# Patient Record
Sex: Male | Born: 1940 | Race: Black or African American | Hispanic: No | Marital: Married | Smoking: Former smoker
Health system: Southern US, Community
[De-identification: ages and names within clinical notes are randomized; demographics above are authoritative.]

## PROBLEM LIST (undated history)

## (undated) DIAGNOSIS — F329 Major depressive disorder, single episode, unspecified: Secondary | ICD-10-CM

## (undated) DIAGNOSIS — F4310 Post-traumatic stress disorder, unspecified: Secondary | ICD-10-CM

## (undated) DIAGNOSIS — E669 Obesity, unspecified: Secondary | ICD-10-CM

## (undated) DIAGNOSIS — F32A Depression, unspecified: Secondary | ICD-10-CM

## (undated) DIAGNOSIS — I480 Paroxysmal atrial fibrillation: Secondary | ICD-10-CM

## (undated) DIAGNOSIS — I872 Venous insufficiency (chronic) (peripheral): Secondary | ICD-10-CM

## (undated) DIAGNOSIS — I10 Essential (primary) hypertension: Secondary | ICD-10-CM

## (undated) DIAGNOSIS — I509 Heart failure, unspecified: Secondary | ICD-10-CM

## (undated) DIAGNOSIS — G4733 Obstructive sleep apnea (adult) (pediatric): Secondary | ICD-10-CM

## (undated) DIAGNOSIS — F1910 Other psychoactive substance abuse, uncomplicated: Secondary | ICD-10-CM

## (undated) DIAGNOSIS — E278 Other specified disorders of adrenal gland: Secondary | ICD-10-CM

## (undated) DIAGNOSIS — F1011 Alcohol abuse, in remission: Secondary | ICD-10-CM

## (undated) DIAGNOSIS — E291 Testicular hypofunction: Secondary | ICD-10-CM

## (undated) DIAGNOSIS — I82409 Acute embolism and thrombosis of unspecified deep veins of unspecified lower extremity: Secondary | ICD-10-CM

## (undated) DIAGNOSIS — E119 Type 2 diabetes mellitus without complications: Secondary | ICD-10-CM

## (undated) DIAGNOSIS — M1990 Unspecified osteoarthritis, unspecified site: Secondary | ICD-10-CM

## (undated) HISTORY — DX: Alcohol abuse, in remission: F10.11

## (undated) HISTORY — DX: Paroxysmal atrial fibrillation: I48.0

## (undated) HISTORY — DX: Venous insufficiency (chronic) (peripheral): I87.2

## (undated) HISTORY — DX: Post-traumatic stress disorder, unspecified: F43.10

## (undated) HISTORY — DX: Testicular hypofunction: E29.1

## (undated) HISTORY — DX: Obstructive sleep apnea (adult) (pediatric): G47.33

## (undated) HISTORY — DX: Other specified disorders of adrenal gland: E27.8

## (undated) HISTORY — DX: Type 2 diabetes mellitus without complications: E11.9

## (undated) HISTORY — DX: Essential (primary) hypertension: I10

## (undated) HISTORY — DX: Unspecified osteoarthritis, unspecified site: M19.90

## (undated) HISTORY — DX: Other psychoactive substance abuse, uncomplicated: F19.10

## (undated) HISTORY — DX: Major depressive disorder, single episode, unspecified: F32.9

## (undated) HISTORY — DX: Heart failure, unspecified: I50.9

## (undated) HISTORY — DX: Obesity, unspecified: E66.9

## (undated) HISTORY — DX: Acute embolism and thrombosis of unspecified deep veins of unspecified lower extremity: I82.409

## (undated) HISTORY — DX: Depression, unspecified: F32.A

---

## 2013-07-15 ENCOUNTER — Other Ambulatory Visit: Payer: Self-pay | Admitting: *Deleted

## 2013-07-15 MED ORDER — OXYCODONE HCL 10 MG PO TABS: 10.0000 mg | ORAL_TABLET | ORAL | Status: DC

## 2013-07-15 NOTE — Telephone Encounter (Signed)
rx filled per protocol  

## 2013-07-18 ENCOUNTER — Non-Acute Institutional Stay (SKILLED_NURSING_FACILITY): Payer: Medicare Other | Admitting: Family

## 2013-07-18 DIAGNOSIS — N189 Chronic kidney disease, unspecified: Secondary | ICD-10-CM

## 2013-07-18 DIAGNOSIS — E1310 Other specified diabetes mellitus with ketoacidosis without coma: Secondary | ICD-10-CM

## 2013-07-18 DIAGNOSIS — E1110 Type 2 diabetes mellitus with ketoacidosis without coma: Secondary | ICD-10-CM

## 2013-07-18 DIAGNOSIS — I10 Essential (primary) hypertension: Secondary | ICD-10-CM

## 2013-07-18 DIAGNOSIS — E270 Other adrenocortical overactivity: Secondary | ICD-10-CM

## 2013-07-18 DIAGNOSIS — E249 Cushing's syndrome, unspecified: Secondary | ICD-10-CM

## 2013-07-18 DIAGNOSIS — I872 Venous insufficiency (chronic) (peripheral): Secondary | ICD-10-CM

## 2013-07-22 ENCOUNTER — Non-Acute Institutional Stay (SKILLED_NURSING_FACILITY): Payer: Medicare Other | Admitting: Internal Medicine

## 2013-07-22 DIAGNOSIS — R404 Transient alteration of awareness: Secondary | ICD-10-CM

## 2013-07-22 DIAGNOSIS — N139 Obstructive and reflux uropathy, unspecified: Secondary | ICD-10-CM

## 2013-07-22 DIAGNOSIS — N179 Acute kidney failure, unspecified: Secondary | ICD-10-CM

## 2013-07-22 DIAGNOSIS — E1129 Type 2 diabetes mellitus with other diabetic kidney complication: Secondary | ICD-10-CM
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
## 2013-07-25 ENCOUNTER — Encounter: Payer: Self-pay | Admitting: Family

## 2013-07-25 ENCOUNTER — Non-Acute Institutional Stay (SKILLED_NURSING_FACILITY): Payer: Medicare Other | Admitting: Family

## 2013-07-25 DIAGNOSIS — I872 Venous insufficiency (chronic) (peripheral): Secondary | ICD-10-CM | POA: Insufficient documentation

## 2013-07-25 DIAGNOSIS — E1110 Type 2 diabetes mellitus with ketoacidosis without coma: Secondary | ICD-10-CM | POA: Insufficient documentation

## 2013-07-25 DIAGNOSIS — N179 Acute kidney failure, unspecified: Secondary | ICD-10-CM

## 2013-07-25 DIAGNOSIS — E270 Other adrenocortical overactivity: Secondary | ICD-10-CM | POA: Insufficient documentation

## 2013-07-25 DIAGNOSIS — I10 Essential (primary) hypertension: Secondary | ICD-10-CM | POA: Insufficient documentation

## 2013-07-25 DIAGNOSIS — N189 Chronic kidney disease, unspecified: Secondary | ICD-10-CM | POA: Insufficient documentation

## 2013-07-25 DIAGNOSIS — E249 Cushing's syndrome, unspecified: Secondary | ICD-10-CM

## 2013-07-25 MED ORDER — SPIRONOLACTONE 100 MG PO TABS: 50.0000 mg | ORAL_TABLET | Freq: Every day | ORAL | Status: DC

## 2013-07-25 NOTE — Progress Notes (Signed)
Patient ID: Sean Davidson, male   DOB: 07/05/1941, 72 y.o.   MRN: [id number]  Date: 07/18/13  Facility: [facility]  Code Status:  Full  Chief Complaint  Patient presents with  . Hospitalization Follow-up    HPI:Pt is being following s/p hospitalization for DVT to left popliteal, + troponin level, afib, and volume overload. The patient presents with no complaints of N/V/D/fever, chills, CP or SOB.  No complaint expressed by pt or health care team.       Allergies  Allergen Reactions  . Indocin [Indomethacin]   . Penicillins   . Sulfa Antibiotics      Medication List       This list is accurate as of: 07/18/13 11:59 PM.  Always use your most recent med list.               acetaminophen 325 MG tablet  Commonly known as:  TYLENOL  Take 650 mg by mouth 3 (three) times daily.     aspirin EC 81 MG tablet  Take 81 mg by mouth daily.     atorvastatin 40 MG tablet  Commonly known as:  LIPITOR  Take 40 mg by mouth daily.     capsicum 0.075 % topical cream  Commonly known as:  ZOSTRIX  Apply 1 application topically as needed.     cyanocobalamin 1000 MCG tablet  Take 1,000 mcg by mouth daily.     diltiazem 180 MG 24 hr capsule  Commonly known as:  DILACOR XR  Take 180 mg by mouth daily.     docusate sodium 100 MG capsule  Commonly known as:  COLACE  Take 100 mg by mouth 2 (two) times daily.     gabapentin 300 MG capsule  Commonly known as:  NEURONTIN  Take 300 mg by mouth 3 (three) times daily.     insulin aspart 100 UNIT/ML injection  Commonly known as:  novoLOG  Inject into the skin 3 (three) times daily with meals.     insulin glargine 100 UNIT/ML injection  Commonly known as:  LANTUS  Inject 80 Units into the skin at bedtime.     ketoconazole 2 % cream  Commonly known as:  NIZORAL  Apply 1 application topically daily.     metaxalone 800 MG tablet  Commonly known as:  SKELAXIN  Take 800 mg by mouth 3 (three) times daily.     methylcellulose 1  % ophthalmic solution  Commonly known as:  ARTIFICIAL TEARS  1 drop 4 (four) times daily.     metoprolol succinate 100 MG 24 hr tablet  Commonly known as:  TOPROL-XL  Take 100 mg by mouth daily. Take with or immediately following a meal.     morphine 15 MG 12 hr tablet  Commonly known as:  MS CONTIN  Take 15 mg by mouth every 12 (twelve) hours.     omeprazole 20 MG capsule  Commonly known as:  PRILOSEC  Take 20 mg by mouth daily.     Oxycodone HCl 10 MG Tabs  Take 1 tablet (10 mg total) by mouth as directed. Take 1 tablet by mouth 4 times a day as needed for pain *NOTE DOSE*     sertraline 100 MG tablet  Commonly known as:  ZOLOFT  Take 100 mg by mouth daily.     spironolactone 100 MG tablet  Commonly known as:  ALDACTONE  Take 100 mg by mouth daily.     terazosin 10 MG capsule  Commonly  known as:  HYTRIN  Take 10 mg by mouth at bedtime.     torsemide 20 MG tablet  Commonly known as:  DEMADEX  Take 20 mg by mouth daily.     warfarin 4 MG tablet  Commonly known as:  COUMADIN  Take 4 mg by mouth daily.         DATA REVIEWED    Laboratory Studies: Reviewed Past Medical History  Diagnosis Date  . CHF (congestive heart failure)   . Hypertension   . Diabetes mellitus without complication   . Adrenal hyperplasia     ACTH-independent macronodular  . Venous insufficiency of leg     BLE-with ulcerations  . PTSD (post-traumatic stress disorder)   . Depression   . Substance abuse   . H/O ETOH abuse   . Obesity   . PAF (paroxysmal atrial fibrillation)   . OSA (obstructive sleep apnea)     CPAP nightly   . DVT, lower extremity     Left Popliteal  . Hypogonadism male   . Arthritis    Review of Systems  Constitutional: Negative.   Eyes: Positive for blurred vision. Negative for pain, discharge and redness.       Corrective lenses  Gastrointestinal: Negative.   Genitourinary: Negative.   Musculoskeletal: Positive for back pain and joint pain.  Skin:  Positive for itching.       Leg Ulcers  Neurological: Negative.   Endo/Heme/Allergies: Negative.   Psychiatric/Behavioral: Positive for depression.       Denies SI/HI     Physical Exam Filed Vitals:   07/25/13 1412  BP: 126/68  Pulse: 86  Temp: 98.6 F (37 C)  Resp: 18   There is no height or weight on file to calculate BMI. Physical Exam  Constitutional: He is oriented to person, place, and time. No distress.  HENT:  Head: Normocephalic.  Mouth/Throat: Oropharynx is clear and moist.  Eyes: Conjunctivae are normal. Pupils are equal, round, and reactive to light.  Neck: Normal range of motion. No thyromegaly present.  Cardiovascular: Normal rate, regular rhythm and normal heart sounds.   3 + Bilateral lower extremity swelling  Pulmonary/Chest: Effort normal and breath sounds normal.  Abdominal: He exhibits distension.  Obese  Genitourinary:  Urine assessed in urinal- clear and amber  Musculoskeletal:       Lumbar back: He exhibits pain.  Granulated midline lumbar scar  Neurological: He is alert and oriented to person, place, and time. GCS eye subscore is 4. GCS verbal subscore is 5. GCS motor subscore is 6.  Psychiatric: He has a normal mood and affect. His speech is normal and behavior is normal. Cognition and memory are normal.    ASSESSMENT/PLAN  CBC w/ diff/CMP Decrease Spirolactone to 50 mg bid  Follow up:prn  Spent 60 minutes

## 2013-07-25 NOTE — Progress Notes (Signed)
Patient ID: Sean Davidson, male   DOB: [birth year], 72 y.o.   MRN: [id number]  Date 07/25/13 Facility: [facility]  Code Status:  Full  Chief Complaint  Patient presents with  . Acute Visit    Abnormal Labs    HPI: Health care team request patient f/u for abnormal labs.  Pt is being followed for the medical management of chronic illnesses. Pt denies N/V/D, fever, chills; however, reports generalized weakness onset  3 days. Pt reports inability to perform ADL's independently,pt reports feeding assistance (which is change from patient's baseline).       Allergies  Allergen Reactions  . Indocin [Indomethacin]   . Penicillins   . Sulfa Antibiotics      Medication List       This list is accurate as of: 07/25/13  3:56 PM.  Always use your most recent med list.               acetaminophen 325 MG tablet  Commonly known as:  TYLENOL  Take 650 mg by mouth 3 (three) times daily.     aspirin EC 81 MG tablet  Take 81 mg by mouth daily.     atorvastatin 40 MG tablet  Commonly known as:  LIPITOR  Take 40 mg by mouth daily.     capsicum 0.075 % topical cream  Commonly known as:  ZOSTRIX  Apply 1 application topically as needed.     cyanocobalamin 1000 MCG tablet  Take 1,000 mcg by mouth daily.     diltiazem 180 MG 24 hr capsule  Commonly known as:  DILACOR XR  Take 180 mg by mouth daily.     docusate sodium 100 MG capsule  Commonly known as:  COLACE  Take 100 mg by mouth 2 (two) times daily.     gabapentin 300 MG capsule  Commonly known as:  NEURONTIN  Take 300 mg by mouth 3 (three) times daily.     insulin aspart 100 UNIT/ML injection  Commonly known as:  novoLOG  Inject into the skin 3 (three) times daily with meals.     insulin glargine 100 UNIT/ML injection  Commonly known as:  LANTUS  Inject 80 Units into the skin at bedtime.     ketoconazole 2 % cream  Commonly known as:  NIZORAL  Apply 1 application topically daily.     metaxalone 800 MG tablet    Commonly known as:  SKELAXIN  Take 800 mg by mouth 3 (three) times daily.     methylcellulose 1 % ophthalmic solution  Commonly known as:  ARTIFICIAL TEARS  1 drop 4 (four) times daily.     metoprolol succinate 100 MG 24 hr tablet  Commonly known as:  TOPROL-XL  Take 100 mg by mouth daily. Take with or immediately following a meal.     morphine 15 MG 12 hr tablet  Commonly known as:  MS CONTIN  Take 15 mg by mouth every 12 (twelve) hours.     omeprazole 20 MG capsule  Commonly known as:  PRILOSEC  Take 20 mg by mouth daily.     Oxycodone HCl 10 MG Tabs  Take 1 tablet (10 mg total) by mouth as directed. Take 1 tablet by mouth 4 times a day as needed for pain *NOTE DOSE*     sertraline 100 MG tablet  Commonly known as:  ZOLOFT  Take 100 mg by mouth daily.     spironolactone 100 MG tablet  Commonly known as:  ALDACTONE  Take 0.5 tablets (50 mg total) by mouth daily.     terazosin 10 MG capsule  Commonly known as:  HYTRIN  Take 10 mg by mouth at bedtime.     torsemide 20 MG tablet  Commonly known as:  DEMADEX  Take 20 mg by mouth daily.     warfarin 4 MG tablet  Commonly known as:  COUMADIN  Take 4 mg by mouth daily.         DATA REVIEWED  Laboratory Studies: 07/25/13-BUN 100, Creatinine 1.42, Sodium 148, Potassium 4.7, Chloride 104, Calcium 9.9 07/22/13-BUN 116, Creatinine4.23, Sodium 135, Potassium 5.7, Chloride 94, Calcium 9.2 Past Medical History  Diagnosis Date  . CHF (congestive heart failure)   . Hypertension   . Diabetes mellitus without complication   . Adrenal hyperplasia     ACTH-independent macronodular  . Venous insufficiency of leg     BLE-with ulcerations  . PTSD (post-traumatic stress disorder)   . Depression   . Substance abuse   . H/O ETOH abuse   . Obesity   . PAF (paroxysmal atrial fibrillation)   . OSA (obstructive sleep apnea)     CPAP nightly   . DVT, lower extremity     Left Popliteal  . Hypogonadism male   . Arthritis      Review of Systems  Constitutional: Negative.   HENT: Negative.   Eyes: Positive for blurred vision.       Corrective lenses  Respiratory: Positive for shortness of breath.        Intermittent  Cardiovascular: Positive for leg swelling.  Gastrointestinal: Negative.   Genitourinary:       Foley Catheter  Musculoskeletal: Positive for back pain.  Skin:       Leg Ulcers  Neurological: Positive for focal weakness.  Endo/Heme/Allergies: Negative.   Psychiatric/Behavioral: Negative.      Physical Exam Filed Vitals:   07/25/13 1550  BP: 140/70  Pulse: 88  Temp: 97.7 F (36.5 C)  Resp: 18   There is no height or weight on file to calculate BMI. Physical Exam  Constitutional: He is oriented to person, place, and time. He appears listless.  HENT:  Head: Normocephalic.  Cardiovascular: Normal rate, regular rhythm and normal heart sounds.   Pulses:      Radial pulses are 3+ on the right side, and 3+ on the left side.       Dorsalis pedis pulses are 1+ on the right side, and 1+ on the left side.  2+ bilat. Pedal edema  Pulmonary/Chest: Effort normal and breath sounds normal.  SOB with moderate exertion noted  Abdominal: Bowel sounds are normal. There is no rebound and no guarding.  Ecchymosis approx. 3 cm to R of umbilicus/ Obese  Genitourinary:  Foley catheter draining yellow urine  Musculoskeletal:       Lumbar back: He exhibits decreased range of motion and pain.  Grimacing noted with position change/palpation  Neurological: He is oriented to person, place, and time. He appears listless.  Generalized Weakness  Skin:  Leg Wraps to BLE intact     ASSESSMENT/PLAN  Acute on Chronic Renal Disease vs Renal Insufficiency -Obtain IV Access -0.45 NS 100 cc/hr x 48 hours -Strict I &O's -Continue Daily Weights -BMP daily for 2 days -Hold Torsemide, Metolazone, Spirolactone -V/s q 4 hours with O2 sats x 48 hours Consultation with Dr. Leanord Hawking for plan  Follow  up:prn  Spent 60 minutes

## 2013-07-26 ENCOUNTER — Non-Acute Institutional Stay (SKILLED_NURSING_FACILITY): Payer: Medicare Other | Admitting: Internal Medicine

## 2013-07-26 DIAGNOSIS — N189 Chronic kidney disease, unspecified: Secondary | ICD-10-CM

## 2013-07-26 DIAGNOSIS — N179 Acute kidney failure, unspecified: Secondary | ICD-10-CM

## 2013-07-26 DIAGNOSIS — R404 Transient alteration of awareness: Secondary | ICD-10-CM

## 2013-07-26 NOTE — Progress Notes (Signed)
Patient ID: Sean Davidson, male   DOB: 1940-09-09, 72 y.o.   MRN: [id number] Facility; [facility] Chief complaint; followup acute on chronic renal failure, delirium History; this is a patient I saw on 11/28 to. He was admitted the facility after being admitted for a chronic period to the [facility]. He has chronic renal insufficiency which was a stage II listed as a baseline creatinine of 1.3 on his discharge summary. He is also listed as having diastolic heart failure, an enlarged prostate, and an ACTH independent macro nodular adrenal hyperplasia on chronic spironolactone. When I saw him on November 28 we have already noted a BUN of 96 a creatinine of 2.3 and a potassium of 5.6. He was on an ACE inhibitor [lisinopril at 5 mg daily] metolazone, Demadex and spironolactone. To complicate matters further it was noted that he was in significant urinary retention and was catheterized for 1200 cc of urine. Subsequent lab work that day revealed a BUN of 116 a creatinine of 4.23 although this was before he is a catheter was placed. Followup lab work from yesterday showed a BUN of 100 a creatinine of 1.4 to potassium of 4.7. His white count is 12 hemoglobin 13 platelets of 191,000 there is a slight left shift with 85% neutrophils. He has since been giving him IV fluids at 100 cc an hour  Further I noted acute delirium when I saw him on 11/28 the. His Zoloft and Neurontin were held. He was placed on empiric doxycycline for concern of a urinary tract infection I don't see the final results of this  Review of systems Respiratory patient is not complaining of cough or shortness of breath Cardiac no chest pain or palpitations GI no abdominal pain GU he has a Foley catheter in place  Physical examination  Gen.; patient looks remarkably better than when I saw him 3 days ago. He is alert and responsive Blood pressure 150/70 temperature 97.7 pulse 90 respirations 20 O2 sat 96%  Respiratoryr shallow but otherwise  clear entry bilaterally Cardiac heart sounds are normal no murmurs no obvious congestive heart failure Abdomen; distended however no tenderness no masses. GU catheter draining clear yellow urine  Impression/plan #1 acute on chronic renal failure; it would appear that we have managed to treat a very serious situation successfully. I suspect most of this was probably due to post renal factors, however prerenal factors as well as ACE inhibitors etc. could be playing a role. I will repeat his basic metabolic panel tomorrow. At some point he will probably need to go back on some of his diuretics however that is not now #2 severe urinary retention secondary to BPH. I will attempt to check his prostate when I'm here later in the week #3 acute delirium secondary to acute renal failure perhaps some degree of medication induced delirium #4 question pyelonephritis he is on doxycycline I am awaiting a culture.

## 2013-07-28 ENCOUNTER — Non-Acute Institutional Stay (SKILLED_NURSING_FACILITY): Payer: Medicare Other | Admitting: Internal Medicine

## 2013-07-28 DIAGNOSIS — I872 Venous insufficiency (chronic) (peripheral): Secondary | ICD-10-CM

## 2013-07-28 DIAGNOSIS — R140 Abdominal distension (gaseous): Secondary | ICD-10-CM

## 2013-07-28 DIAGNOSIS — R141 Gas pain: Secondary | ICD-10-CM

## 2013-07-28 DIAGNOSIS — N189 Chronic kidney disease, unspecified: Secondary | ICD-10-CM

## 2013-08-02 ENCOUNTER — Emergency Department (HOSPITAL_COMMUNITY)
Admission: EM | Admit: 2013-08-02 | Discharge: 2013-08-03 | Disposition: A | Discharge status: Nursing Home (Includes ICF) | Payer: Medicare Other | Attending: Emergency Medicine | Admitting: Emergency Medicine

## 2013-08-02 ENCOUNTER — Emergency Department (HOSPITAL_COMMUNITY): Payer: Medicare Other

## 2013-08-02 ENCOUNTER — Encounter (HOSPITAL_COMMUNITY): Payer: Self-pay | Admitting: Emergency Medicine

## 2013-08-02 DIAGNOSIS — Z86718 Personal history of other venous thrombosis and embolism: Secondary | ICD-10-CM | POA: Insufficient documentation

## 2013-08-02 DIAGNOSIS — K5900 Constipation, unspecified: Secondary | ICD-10-CM | POA: Insufficient documentation

## 2013-08-02 DIAGNOSIS — F3289 Other specified depressive episodes: Secondary | ICD-10-CM | POA: Insufficient documentation

## 2013-08-02 DIAGNOSIS — G4733 Obstructive sleep apnea (adult) (pediatric): Secondary | ICD-10-CM | POA: Insufficient documentation

## 2013-08-02 DIAGNOSIS — F329 Major depressive disorder, single episode, unspecified: Secondary | ICD-10-CM | POA: Insufficient documentation

## 2013-08-02 DIAGNOSIS — R109 Unspecified abdominal pain: Secondary | ICD-10-CM | POA: Insufficient documentation

## 2013-08-02 DIAGNOSIS — M129 Arthropathy, unspecified: Secondary | ICD-10-CM | POA: Insufficient documentation

## 2013-08-02 DIAGNOSIS — F4310 Post-traumatic stress disorder, unspecified: Secondary | ICD-10-CM | POA: Insufficient documentation

## 2013-08-02 DIAGNOSIS — Z87891 Personal history of nicotine dependence: Secondary | ICD-10-CM | POA: Insufficient documentation

## 2013-08-02 DIAGNOSIS — Z794 Long term (current) use of insulin: Secondary | ICD-10-CM | POA: Insufficient documentation

## 2013-08-02 DIAGNOSIS — Z7901 Long term (current) use of anticoagulants: Secondary | ICD-10-CM | POA: Insufficient documentation

## 2013-08-02 DIAGNOSIS — I509 Heart failure, unspecified: Secondary | ICD-10-CM | POA: Insufficient documentation

## 2013-08-02 DIAGNOSIS — N390 Urinary tract infection, site not specified: Secondary | ICD-10-CM | POA: Insufficient documentation

## 2013-08-02 DIAGNOSIS — I10 Essential (primary) hypertension: Secondary | ICD-10-CM | POA: Insufficient documentation

## 2013-08-02 DIAGNOSIS — I4891 Unspecified atrial fibrillation: Secondary | ICD-10-CM | POA: Insufficient documentation

## 2013-08-02 DIAGNOSIS — E669 Obesity, unspecified: Secondary | ICD-10-CM | POA: Insufficient documentation

## 2013-08-02 DIAGNOSIS — Z7982 Long term (current) use of aspirin: Secondary | ICD-10-CM | POA: Insufficient documentation

## 2013-08-02 DIAGNOSIS — Z79899 Other long term (current) drug therapy: Secondary | ICD-10-CM | POA: Insufficient documentation

## 2013-08-02 DIAGNOSIS — Z872 Personal history of diseases of the skin and subcutaneous tissue: Secondary | ICD-10-CM | POA: Insufficient documentation

## 2013-08-02 DIAGNOSIS — F29 Unspecified psychosis not due to a substance or known physiological condition: Secondary | ICD-10-CM | POA: Insufficient documentation

## 2013-08-02 DIAGNOSIS — Z880 Allergy status to penicillin: Secondary | ICD-10-CM | POA: Insufficient documentation

## 2013-08-02 DIAGNOSIS — E119 Type 2 diabetes mellitus without complications: Secondary | ICD-10-CM | POA: Insufficient documentation

## 2013-08-02 LAB — URINALYSIS, ROUTINE W REFLEX MICROSCOPIC
Bilirubin Urine: NEGATIVE
Glucose, UA: >1000 mg/dL — AB
Ketones, ur: NEGATIVE mg/dL
Protein, ur: 30 mg/dL — AB
Specific Gravity, Urine: 1.033 — ABNORMAL HIGH (ref 1.005–1.030)
pH: 5 (ref 5.0–8.0)

## 2013-08-02 LAB — CBC WITH DIFFERENTIAL/PLATELET
Basophils Absolute: 0 10*3/uL (ref 0.0–0.1)
Eosinophils Absolute: 0.1 10*3/uL (ref 0.0–0.7)
HCT: 41.1 % (ref 39.0–52.0)
Hemoglobin: 13.2 g/dL (ref 13.0–17.0)
Lymphocytes Relative: 12 % (ref 12–46)
Lymphs Abs: 1.6 10*3/uL (ref 0.7–4.0)
Monocytes Absolute: 0.6 10*3/uL (ref 0.1–1.0)
Monocytes Relative: 5 % (ref 3–12)
Neutro Abs: 11 10*3/uL — ABNORMAL HIGH (ref 1.7–7.7)
RBC: 3.76 MIL/uL — ABNORMAL LOW (ref 4.22–5.81)
RDW: 14.9 % (ref 11.5–15.5)
WBC: 13.3 10*3/uL — ABNORMAL HIGH (ref 4.0–10.5)

## 2013-08-02 LAB — HEPATIC FUNCTION PANEL
ALT: 29 U/L (ref 0–53)
AST: 31 U/L (ref 0–37)
Alkaline Phosphatase: 43 U/L (ref 39–117)
Bilirubin, Direct: <0.1 mg/dL (ref 0.0–0.3)

## 2013-08-02 LAB — URINE MICROSCOPIC-ADD ON

## 2013-08-02 LAB — POCT I-STAT TROPONIN I

## 2013-08-02 LAB — BASIC METABOLIC PANEL
BUN: 28 mg/dL — ABNORMAL HIGH (ref 6–23)
CO2: 26 mEq/L (ref 19–32)
Calcium: 9.4 mg/dL (ref 8.4–10.5)
Chloride: 99 mEq/L (ref 96–112)
Creatinine, Ser: 1.21 mg/dL (ref 0.50–1.35)

## 2013-08-02 LAB — LIPASE, BLOOD: Lipase: 40 U/L (ref 11–59)

## 2013-08-02 MED ORDER — IOHEXOL 300 MG/ML  SOLN
25.0000 mL | INTRAMUSCULAR | Status: AC
  Administered 2013-08-02: 25 mL via ORAL

## 2013-08-02 NOTE — ED Notes (Signed)
Pt informed RN "he doesn't shit"  "hasnt had BM since he was a 72 year old boy" Belly pain 10/10 all over; reported to be tender upon palpation.

## 2013-08-02 NOTE — ED Notes (Signed)
Pt not in room; still in radiology.

## 2013-08-02 NOTE — ED Notes (Signed)
Pt has urinary catheter placed; pt reports he could not pee.

## 2013-08-02 NOTE — ED Notes (Signed)
Patient transported to X-ray 

## 2013-08-02 NOTE — ED Notes (Signed)
Pt drinking contrast; informed by CT tech that he will be waiting 2 hours before test.

## 2013-08-02 NOTE — ED Provider Notes (Signed)
CSN: [id number]     Arrival date & time 08/02/13  1705 History   First MD Initiated Contact with Patient 08/02/13 1716     Chief Complaint  Patient presents with  . Altered Mental Status   (Consider location/radiation/quality/duration/timing/severity/associated sxs/prior Treatment) HPI Comments: 72 year old male presents from [facility] rehabilitation with confusion. He was admitted there 2 weeks ago after being discharged from the [facility] M[facility]. When talking with the nurse at the facility she states that the patient is at the baseline is that when he came from the [facility] but family feels like he is worse than normal. They relate that he is alert and oriented x3. He has had some problems with obstructive renal failure, at one point he had a maximum BUN of 116 and creatinine of 4. These have been trending down further labs since he had a Foley placed. He is currently on doxycycline for a UTI. The patient states that he is here because she's not had a bowel movement several days and feels like his abdomen is distending due to this. He relates that it is Tuesday, December but thinks it is 2012. He is alert and he is oriented to the president and where he is.   Past Medical History  Diagnosis Date  . CHF (congestive heart failure)   . Hypertension   . Diabetes mellitus without complication   . Adrenal hyperplasia     ACTH-independent macronodular  . Venous insufficiency of leg     BLE-with ulcerations  . PTSD (post-traumatic stress disorder)   . Depression   . Substance abuse   . H/O ETOH abuse   . Obesity   . PAF (paroxysmal atrial fibrillation)   . OSA (obstructive sleep apnea)     CPAP nightly   . DVT, lower extremity     Left Popliteal  . Hypogonadism male   . Arthritis    History reviewed. No pertinent past surgical history. No family history on file. History  Substance Use Topics  . Smoking status: Former Games developer  . Smokeless tobacco: Not on file  . Alcohol Use: No    Review  of Systems  Constitutional: Negative for fever.  Respiratory: Negative for shortness of breath.   Cardiovascular: Negative for chest pain.  Gastrointestinal: Positive for abdominal pain and constipation. Negative for vomiting.  Psychiatric/Behavioral: Positive for confusion.    Allergies  Indocin; Penicillins; and Sulfa antibiotics  Home Medications   Current Outpatient Rx  Name  Route  Sig  Dispense  Refill  . acetaminophen (TYLENOL) 325 MG tablet   Oral   Take 650 mg by mouth 3 (three) times daily as needed for mild pain or fever.          Marland Kitchen aspirin EC 81 MG tablet   Oral   Take 81 mg by mouth daily.         Marland Kitchen atorvastatin (LIPITOR) 40 MG tablet   Oral   Take 40 mg by mouth at bedtime.          . capsaicin (ZOSTRIX) 0.025 % cream   Topical   Apply 1 application topically daily as needed (for pain).         . cyanocobalamin 1000 MCG tablet   Oral   Take 1,000 mcg by mouth daily.         Marland Kitchen diltiazem (CARDIZEM CD) 180 MG 24 hr capsule   Oral   Take 180 mg by mouth daily.         Marland Kitchen  docusate sodium (COLACE) 100 MG capsule   Oral   Take 200 mg by mouth 2 (two) times daily.          Marland Kitchen gabapentin (NEURONTIN) 300 MG capsule   Oral   Take 300 mg by mouth 3 (three) times daily.         . insulin aspart (NOVOLOG) 100 UNIT/ML injection   Subcutaneous   Inject into the skin 3 (three) times daily with meals. Sliding scale         . insulin glargine (LANTUS) 100 UNIT/ML injection   Subcutaneous   Inject 80 Units into the skin at bedtime.         Marland Kitchen lisinopril (PRINIVIL,ZESTRIL) 5 MG tablet   Oral   Take 5 mg by mouth daily.         . methylcellulose (ARTIFICIAL TEARS) 1 % ophthalmic solution      1 drop 4 (four) times daily.         . metolazone (ZAROXOLYN) 2.5 MG tablet   Oral   Take 2.5 mg by mouth daily as needed (for fluid).         . metoprolol succinate (TOPROL-XL) 100 MG 24 hr tablet   Oral   Take 100 mg by mouth 2 (two) times  daily. Take with or immediately following a meal.         . morphine (MS CONTIN) 15 MG 12 hr tablet   Oral   Take 15 mg by mouth every 12 (twelve) hours.         Marland Kitchen omeprazole (PRILOSEC) 20 MG capsule   Oral   Take 20 mg by mouth daily.         . Oxycodone HCl 10 MG TABS   Oral   Take 1 tablet (10 mg total) by mouth as directed. Take 1 tablet by mouth 4 times a day as needed for pain *NOTE DOSE*   180 tablet   0   . sertraline (ZOLOFT) 100 MG tablet   Oral   Take 150 mg by mouth daily.          . sodium chloride (OCEAN) 0.65 % nasal spray   Nasal   Place 1 spray into the nose at bedtime.         Marland Kitchen spironolactone (ALDACTONE) 50 MG tablet   Oral   Take 50 mg by mouth daily.         Marland Kitchen terazosin (HYTRIN) 10 MG capsule   Oral   Take 10 mg by mouth at bedtime.         . torsemide (DEMADEX) 20 MG tablet   Oral   Take 20 mg by mouth daily.         Marland Kitchen warfarin (COUMADIN) 2.5 MG tablet   Oral   Take 2.5 mg by mouth daily at 6 PM.         . warfarin (COUMADIN) 3 MG tablet   Oral   Take 3 mg by mouth daily at 6 PM.          BP 136/78  Pulse 85  Temp(Src) 98 F (36.7 C)  SpO2 97% Physical Exam  Nursing note and vitals reviewed. Constitutional: He appears well-developed and well-nourished. No distress.  HENT:  Head: Normocephalic and atraumatic.  Right Ear: External ear normal.  Left Ear: External ear normal.  Nose: Nose normal.  Eyes: Right eye exhibits no discharge. Left eye exhibits no discharge.  Neck: Neck supple.  Cardiovascular: Normal rate, regular  rhythm, normal heart sounds and intact distal pulses.   Pulmonary/Chest: Effort normal and breath sounds normal.  Abdominal: Soft. There is tenderness (mild, nonspecific tenderness).  Genitourinary: Rectal exam shows no tenderness.  No stool impaction  Musculoskeletal: He exhibits no edema.  Neurological: He is alert. He has normal strength. He is disoriented (oriented to day of week, location,  person but not year). No cranial nerve deficit or sensory deficit. GCS eye subscore is 4. GCS verbal subscore is 4. GCS motor subscore is 6.  Skin: Skin is warm and dry.    ED Course  Procedures (including critical care time) Labs Review Labs Reviewed  BASIC METABOLIC PANEL - Abnormal; Notable for the following:    Glucose, Bld 248 (*)    BUN 28 (*)    GFR calc non Af Amer 58 (*)    GFR calc Af Amer 67 (*)    All other components within normal limits  HEPATIC FUNCTION PANEL - Abnormal; Notable for the following:    Albumin 3.4 (*)    All other components within normal limits  PROTIME-INR - Abnormal; Notable for the following:    Prothrombin Time 19.1 (*)    INR 1.66 (*)    All other components within normal limits  URINALYSIS, ROUTINE W REFLEX MICROSCOPIC - Abnormal; Notable for the following:    APPearance HAZY (*)    Specific Gravity, Urine 1.033 (*)    Glucose, UA >1000 (*)    Hgb urine dipstick LARGE (*)    Protein, ur 30 (*)    Leukocytes, UA MODERATE (*)    All other components within normal limits  AMMONIA - Abnormal; Notable for the following:    Ammonia 10 (*)    All other components within normal limits  URINE MICROSCOPIC-ADD ON - Abnormal; Notable for the following:    Bacteria, UA MANY (*)    All other components within normal limits  CBC WITH DIFFERENTIAL - Abnormal; Notable for the following:    WBC 13.3 (*)    RBC 3.76 (*)    MCV 109.3 (*)    MCH 35.1 (*)    Neutrophils Relative % 83 (*)    Neutro Abs 11.0 (*)    All other components within normal limits  URINE CULTURE  LIPASE, BLOOD  CBC WITH DIFFERENTIAL  POCT I-STAT TROPONIN I   Imaging Review Dg Chest 2 View  08/02/2013   CLINICAL DATA:  Altered mental status.  EXAM: CHEST  2 VIEW  COMPARISON:  None.  FINDINGS: The lungs are clear. Heart size is upper normal. No pneumothorax or pleural fluid. No focal bony abnormality.  IMPRESSION: No acute disease.   Electronically Signed   By: Drusilla Kanner  M.D.   On: 08/02/2013 18:46   Ct Head Wo Contrast  08/02/2013   CLINICAL DATA:  Altered mental status.  Confusion for 2 weeks.  EXAM: CT HEAD WITHOUT CONTRAST  TECHNIQUE: Contiguous axial images were obtained from the base of the skull through the vertex without intravenous contrast.  COMPARISON:  None.  FINDINGS: Mild generalized cerebral atrophy is present. Periventricular white matter hypodensities are nonspecific but compatible with mild chronic small vessel ischemic disease. There is no evidence of acute cortical infarct, mass, midline shift, intracranial hemorrhage, or extra-axial fluid collection. The orbits, paranasal sinuses, and mastoid air cells are unremarkable.  IMPRESSION: No acute intracranial abnormality.   Electronically Signed   By: Sebastian Ache   On: 08/02/2013 18:55   Dg Abd 2 Views  08/02/2013   CLINICAL DATA:  Altered mental status.  EXAM: ABDOMEN - 2 VIEW  COMPARISON:  None.  FINDINGS: The bowel gas pattern is normal. There is no evidence of free air. No radio-opaque calculi or other significant radiographic abnormality is seen.  IMPRESSION: Negative exam.   Electronically Signed   By: Drusilla Kanner M.D.   On: 08/02/2013 18:46    EKG Interpretation   None       MDM   1. UTI (urinary tract infection)    Patient appears well here, has not acute sx except constipation. Patient appears oriented here, and when discussing with NH they feel he has not had any change in mental status since arrival to their facility 2 weeks ago. Only disoriented to year by 2 years, otherwise knows day of week and month. Urine is dirty but has a foley (based on records had retention causing renal failure, has now since resolved). On doxy, will change to keflex. Rectal, shows minimal stool, no impaction. Given no obvious source for his mild abd pain, will get CT. Care transferred to oncoming provider, with plan to d/c back to facility if CT is benign. Feel he is stable for return to nursing facility  as they have been taking care of him during these 2 weeks and there seems to be no acute, emergent change.    Audree Camel, MD 08/03/13 [phone]

## 2013-08-02 NOTE — ED Notes (Signed)
Phlebotomy called to notify them that the lavender top was coagulated.

## 2013-08-02 NOTE — ED Notes (Signed)
Pt refused to let phlebotomy recollect his blood; RN tried to talk to him about importance of this; pt continued to refuse; MD aware.

## 2013-08-02 NOTE — ED Notes (Signed)
Pt still in radiology.

## 2013-08-02 NOTE — ED Notes (Signed)
MD at bedside. 

## 2013-08-02 NOTE — ED Notes (Signed)
Patient presents via [facility] from [facility] for a chief complaint confusion x 2 weeks. Patient has a history of dementia and aggression.

## 2013-08-03 ENCOUNTER — Emergency Department (HOSPITAL_COMMUNITY): Payer: Medicare Other

## 2013-08-03 ENCOUNTER — Encounter (HOSPITAL_COMMUNITY): Payer: Self-pay | Admitting: Radiology

## 2013-08-03 MED ORDER — CEPHALEXIN 500 MG PO CAPS: 500.0000 mg | ORAL_CAPSULE | Freq: Two times a day (BID) | ORAL | Status: AC

## 2013-08-03 NOTE — ED Notes (Signed)
[facility] call to transfer [facility]

## 2013-08-04 LAB — URINE CULTURE: Colony Count: >=100000

## 2013-08-05 ENCOUNTER — Telehealth (HOSPITAL_COMMUNITY): Payer: Self-pay | Admitting: Emergency Medicine

## 2013-08-05 NOTE — ED Notes (Signed)
Post ED Visit - Positive Culture Follow-up  Culture report reviewed by antimicrobial stewardship pharmacist: []  Wes Dulaney, Pharm.D., BCPS [x]  Celedonio Miyamoto, Pharm.D., BCPS []  Georgina Pillion, [address].D., BCPS []  [place], [address].D., BCPS, AAHIVP []  Estella Husk, Pharm.D., BCPS, AAHIVP  Positive urine culture Treated with Keflex, organism sensitive to the same and no further patient follow-up is required at this time.  Kylie A Holland 08/05/2013, 11:20 AM

## 2013-08-11 ENCOUNTER — Non-Acute Institutional Stay (SKILLED_NURSING_FACILITY): Payer: Medicare Other | Admitting: Internal Medicine

## 2013-08-11 DIAGNOSIS — I509 Heart failure, unspecified: Secondary | ICD-10-CM

## 2013-08-11 DIAGNOSIS — E875 Hyperkalemia: Secondary | ICD-10-CM

## 2013-08-11 DIAGNOSIS — I4891 Unspecified atrial fibrillation: Secondary | ICD-10-CM

## 2013-08-11 DIAGNOSIS — E1159 Type 2 diabetes mellitus with other circulatory complications: Secondary | ICD-10-CM

## 2013-08-13 ENCOUNTER — Encounter: Payer: Self-pay | Admitting: Internal Medicine

## 2013-08-13 DIAGNOSIS — E1159 Type 2 diabetes mellitus with other circulatory complications: Secondary | ICD-10-CM | POA: Insufficient documentation

## 2013-08-13 DIAGNOSIS — E875 Hyperkalemia: Secondary | ICD-10-CM | POA: Insufficient documentation

## 2013-08-13 DIAGNOSIS — I4891 Unspecified atrial fibrillation: Secondary | ICD-10-CM | POA: Insufficient documentation

## 2013-08-13 DIAGNOSIS — I509 Heart failure, unspecified: Secondary | ICD-10-CM | POA: Insufficient documentation

## 2013-08-13 NOTE — Progress Notes (Signed)
       PROGRESS NOTE  DATE: 08/11/2013  FACILITY: Nursing Home Location: [facility]  LEVEL OF CARE: SNF (31)  Routine Visit  CHIEF COMPLAINT:  Manage CHF, diabetes mellitus and atrial fibrillation  HISTORY OF PRESENT ILLNESS:  REASSESSMENT OF ONGOING PROBLEM(S):  CHF:The staff deny orthopnea, PNDs, palpitations or chest pain.  CHF is unstable.  No complications form the medications being used. The staff reports that patient has been gaining weight and also having increasing lower extremity swelling and shortness of breath. Recent weights are 238, 232 and 231. Patient is a poor historian.  ATRIAL FIBRILLATION: the patients atrial fibrillation remains stable.  The staff deny DOE, tachycardia, orthopnea, transient neurological sx, palpitations, & PNDs.  No complications noted from the medications currently being used.  DM:pt's DM remains stable.  Staff deny polyuria, polydipsia, polyphagia, changes in vision or hypoglycemic episodes.  No complications noted from the medication presently being used.  Last hemoglobin A1c is: Not available.  PAST MEDICAL HISTORY : Reviewed.  No changes.  CURRENT MEDICATIONS: Reviewed per [facility]  REVIEW OF SYSTEMS: Unobtainable due to patient being a poor historian  PHYSICAL EXAMINATION  VS:  T 98.1       P 64      RR 18      BP 140/80     POX % 95     WT (Lb) 238  GENERAL: no acute distress, moderately obese body habitus EYES: conjunctivae normal, sclerae normal, normal eye lids NECK: supple, trachea midline, no neck masses, no thyroid tenderness, no thyromegaly LYMPHATICS: no LAN in the neck, no supraclavicular LAN RESPIRATORY: breathing is even & unlabored, BS CTAB CARDIAC: Heart rate is irregularly irregular, no murmur,no extra heart sounds, +3 bilateral lower extremity edema GI: abdomen soft, normal BS, no masses, no tenderness, no hepatomegaly, no splenomegaly PSYCHIATRIC: the patient is alert & oriented to person, affect &  behavior appropriate  LABS/RADIOLOGY: 07-29-13 abdominal ultrasound-negative 11-14 WBC 13, hemoglobin 13.5, MCV 1.7, platelets 235, glucose 199, BUN 96, creatinine 2.3, potassium 5.6 otherwise CMP normal  ASSESSMENT/PLAN:  CHF exacerbation-start Demadex 40 mg one time dose then 20 mg daily. Diabetes mellitus with vascular complications-check hemoglobin A1c Atrial fibrillation-rate controlled Hyperkalemia-new problem. Recheck. Renal insufficiency-recheck Macrocytosis-check vitamin B12 level and RBC folate level Hyperlipidemia-check fasting lipid panel Peripheral neuropathy- no ongoing symptoms BPH-continue Hytrin  CPT CODE: 16109

## 2013-08-15 ENCOUNTER — Other Ambulatory Visit: Payer: Self-pay | Admitting: *Deleted

## 2013-08-15 MED ORDER — MORPHINE SULFATE ER 15 MG PO TBCR: EXTENDED_RELEASE_TABLET | ORAL | Status: DC

## 2013-08-26 ENCOUNTER — Other Ambulatory Visit: Payer: Self-pay | Admitting: *Deleted

## 2013-08-26 MED ORDER — OXYCODONE HCL 10 MG PO TABS: 10.0000 mg | ORAL_TABLET | ORAL | Status: AC

## 2013-09-01 ENCOUNTER — Non-Acute Institutional Stay (SKILLED_NURSING_FACILITY): Payer: Medicare Other | Admitting: Internal Medicine

## 2013-09-01 DIAGNOSIS — E1159 Type 2 diabetes mellitus with other circulatory complications: Secondary | ICD-10-CM

## 2013-09-01 DIAGNOSIS — I4891 Unspecified atrial fibrillation: Secondary | ICD-10-CM

## 2013-09-01 DIAGNOSIS — E875 Hyperkalemia: Secondary | ICD-10-CM

## 2013-09-01 DIAGNOSIS — I509 Heart failure, unspecified: Secondary | ICD-10-CM

## 2013-09-01 NOTE — Progress Notes (Signed)
              PROGRESS NOTE  DATE: 09-01-13  FACILITY: Nursing Home Location: [facility]  LEVEL OF CARE: SNF (31)  Discharge Visit  CHIEF COMPLAINT:  Manage CHF, diabetes mellitus and atrial fibrillation  HISTORY OF PRESENT ILLNESS: I was requested by the social worker to perform face-to-face evaluation discharge.  REASSESSMENT OF ONGOING PROBLEM(S):  CHF:The staff deny orthopnea, PNDs, palpitations or chest pain.  CHF is unstable.  No complications form the medications being used. The staff reports that patient has been gaining weight and also having increasing lower extremity swelling and shortness of breath.  ATRIAL FIBRILLATION: the patients atrial fibrillation remains stable.  The staff deny DOE, tachycardia, orthopnea, transient neurological sx, palpitations, & PNDs.  No complications noted from the medications currently being used.  DM:pt's DM remains stable.  Staff deny polyuria, polydipsia, polyphagia, changes in vision or hypoglycemic episodes.  No complications noted from the medication presently being used.  Last hemoglobin A1c is: 8.5 in 12-14.  PAST MEDICAL HISTORY : Reviewed.  No changes.  [facility]  REVIEW OF SYSTEMS: Unobtainable due to patient being a poor historian  PHYSICAL EXAMINATION  VS:  T 96       P 58     RR 18      BP 120/64   WT (Lb) 231  GENERAL: no acute distress, moderately obese body habitus EYES: conjunctivae normal, sclerae normal, normal eye lids NECK: supple, trachea midline, no neck masses, no thyroid tenderness, no thyromegaly LYMPHATICS: no LAN in the neck, no supraclavicular LAN RESPIRATORY: breathing is even & unlabored, BS CTAB CARDIAC: Heart rate is irregularly irregular, no murmur,no extra heart sounds, bilateral lower extremities edematous & wrapped GI: abdomen soft, normal BS, no masses, no tenderness, no hepatomegaly, no splenomegaly PSYCHIATRIC: the patient is alert & oriented to  person, affect & behavior appropriate  LABS/RADIOLOGY:  08-30-13 BMP normal 12-14 glucose 177 otherwise BMP normal, hemoglobin A1c 8.5, HDL 32 otherwise Fost in the PIP and normal, vitamin B12 level 1435, folate acid level 16.1 07-29-13 abdominal ultrasound-negative 11-14 WBC 13, hemoglobin 13.5, MCV 1.7, platelets 235, glucose 199, BUN 96, creatinine 2.3, potassium 5.6 otherwise CMP normal  ASSESSMENT/PLAN:  CHF exacerbation-well compensated Diabetes mellitus with vascular complications-uncontrolled. Insulin adjusted Atrial fibrillation-rate controlled Hypokalemia-well controlled Hyperlipidemia-well controlled Peripheral neuropathy- no ongoing symptoms BPH-continue Hytrin  I have filled out patient's discharge paperwork and written prescriptions. He will receive home health PT, OT and nurse. DME: Hospital bed (will allow caretaker to provide care, patient is unable to transfer safely from his bed to wheelchair )bedside commode, bedside table (428.0, 719.7, 728.87).  [facility].  Total discharge time greater than 30 minutes. Discharge time involved coordination of the process with the nursing staff, physical therapy Department and medical justification DME.  CPT CODE: [id number]

## 2013-09-12 ENCOUNTER — Other Ambulatory Visit: Payer: Self-pay | Admitting: *Deleted

## 2013-09-12 MED ORDER — MORPHINE SULFATE ER 15 MG PO TBCR: EXTENDED_RELEASE_TABLET | ORAL | Status: AC

## 2013-09-14 DIAGNOSIS — E1165 Type 2 diabetes mellitus with hyperglycemia: Secondary | ICD-10-CM

## 2013-09-14 DIAGNOSIS — I129 Hypertensive chronic kidney disease with stage 1 through stage 4 chronic kidney disease, or unspecified chronic kidney disease: Secondary | ICD-10-CM

## 2013-09-14 DIAGNOSIS — IMO0001 Reserved for inherently not codable concepts without codable children: Secondary | ICD-10-CM

## 2013-09-14 DIAGNOSIS — I5032 Chronic diastolic (congestive) heart failure: Secondary | ICD-10-CM

## 2013-09-14 DIAGNOSIS — I509 Heart failure, unspecified: Secondary | ICD-10-CM

## 2013-09-14 DIAGNOSIS — I872 Venous insufficiency (chronic) (peripheral): Secondary | ICD-10-CM

## 2013-09-14 DIAGNOSIS — L97809 Non-pressure chronic ulcer of other part of unspecified lower leg with unspecified severity: Secondary | ICD-10-CM

## 2013-10-04 ENCOUNTER — Encounter: Payer: Self-pay | Admitting: Internal Medicine

## 2013-10-04 DIAGNOSIS — R140 Abdominal distension (gaseous): Secondary | ICD-10-CM | POA: Insufficient documentation

## 2013-10-04 NOTE — Progress Notes (Signed)
Patient ID: Sean KannerHarold Davidson, male   DOB: March 03, 1941, 73 y.o.   MRN: [id number]          PROGRESS NOTE  DATE: 07/28/2013    FACILITY:  [facility]  LEVEL OF CARE: SNF (31)  Acute Visit  CHIEF COMPLAINT:  Manage lower extremity swelling & chronic kidney disease.    HISTORY OF PRESENT ILLNESS: I was requested by the staff to assess the patient regarding above problem(s):  CHRONIC KIDNEY DISEASE: The patient's chronic kidney disease remains stable.  Last BUN and creatinine are:   On 07/26/2013:  BUN 79, creatinine 1.34.  On 07/13/2013:  BUN 49, creatinine 1.7.  Per staff, patient is confused.  He is very consistent with conversations.    LOWER EXTREMITY SWELLING AND INCREASING ABDOMINAL GIRTH:  Per patient, he has increasing lower extremity swelling and increasing abdominal girth.  He denies abdominal pain, nausea or vomiting.    PAST MEDICAL HISTORY : Reviewed.  No changes.  [facility]  REVIEW OF SYSTEMS:  GENERAL: no change in appetite, no fatigue, no weight changes, no fever, chills or weakness RESPIRATORY: no cough, SOB, DOE,, wheezing, hemoptysis CARDIAC: no chest pain or palpitations; increasing lower extremity swelling   GI: no abdominal pain, diarrhea, constipation, heart burn, nausea or vomiting; increasing abdominal girth    PHYSICAL EXAMINATION  VS:  T 98.7       P 78      RR 18      BP 120/70     POX 94% room air        GENERAL: no acute distress, moderately obese body habitus NECK: supple, trachea midline, no neck masses, no thyroid tenderness, no thyromegaly RESPIRATORY: breathing is even & unlabored, BS CTAB CARDIAC: RRR, no murmur,no extra heart sounds EDEMA/VARICOSITIES:  +1 bilateral lower extremity edema     GI: abdomen distended, firm, no BS, no masses, no tenderness, no hepatomegaly, no splenomegaly PSYCHIATRIC: the patient is alert & oriented to person, affect & behavior appropriate  ASSESSMENT/PLAN:  Abdominal  distention.  Per patient, new problem.  We will obtain an abdominal ultrasound.    Lower extremity edema.  Spironolactone was started.    Chronic kidney disease.  Renal functions improved.    [facility] Metrics:   Aspirin 81 mg.   Former smoker.    CPT CODE: [id number]

## 2013-10-23 DEATH — deceased

## 2014-06-01 IMAGING — CT CT ABD-PELV W/O CM
2 of 4 series · 17 of 46 positions shown, 19 images · non-contrast
Comparison: Abdominal radiographs August 02, 2013

CLINICAL DATA: Confusion, dementia and aggression.  Abdominal pain.

EXAM:
CT ABDOMEN AND PELVIS WITHOUT CONTRAST
TECHNIQUE: Multidetector CT imaging of the abdomen and pelvis was performed
following the standard protocol without intravenous contrast.

[Series 2: routine · axial · 0.93mm/px · z∈[-462,+13]mm · 14 of 105 slices shown, 16 images]
[im 5/105  soft-tissue]
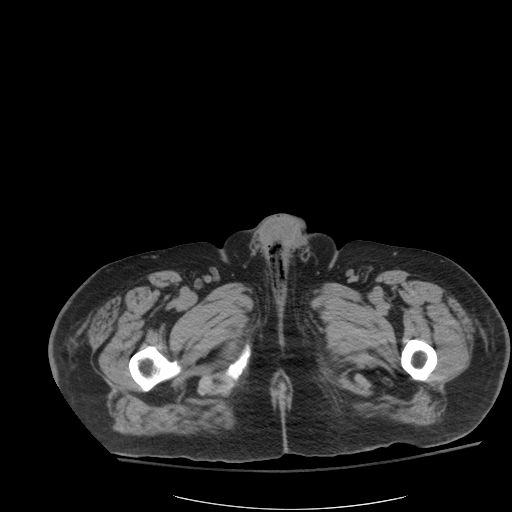
[im 5/105  bone]
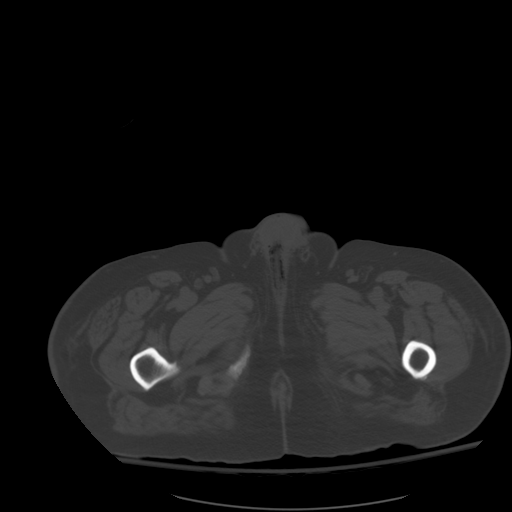
[im 14/105  soft-tissue]
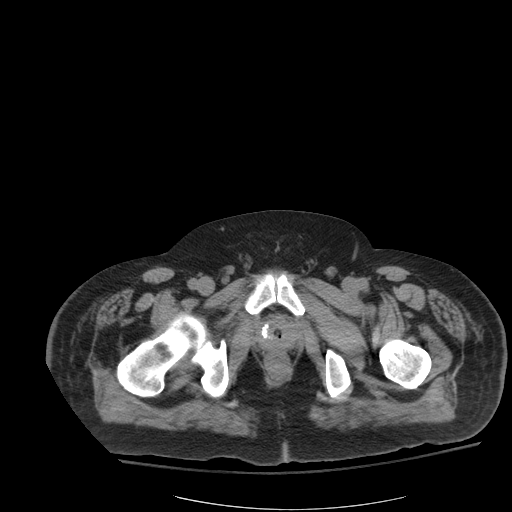
[im 22/105  soft-tissue]
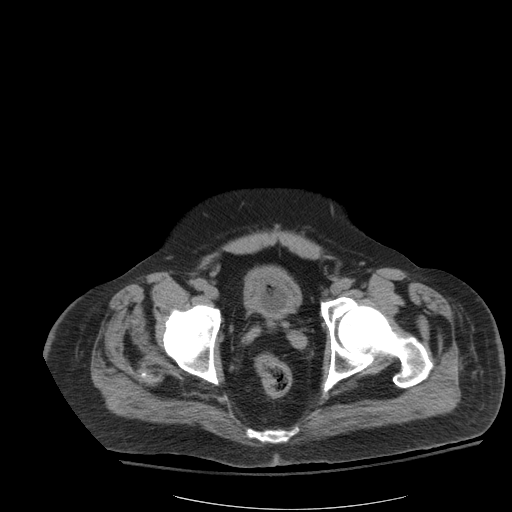
[im 27/105  soft-tissue]
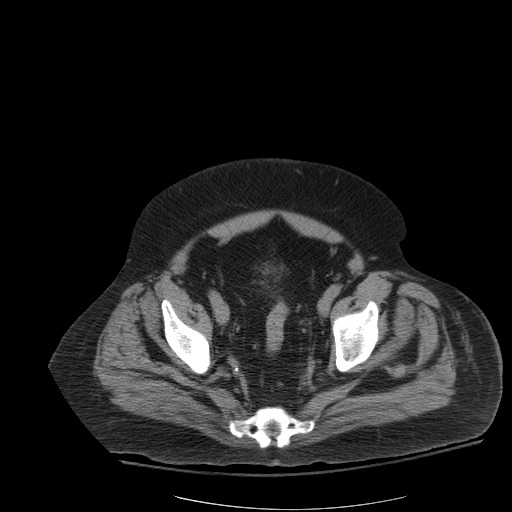
[im 35/105  soft-tissue]
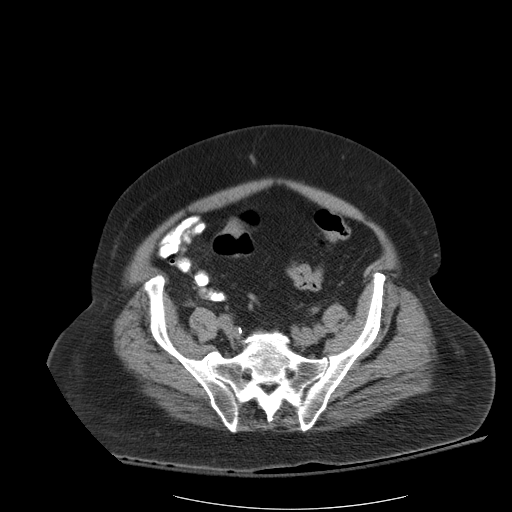
[im 44/105  soft-tissue]
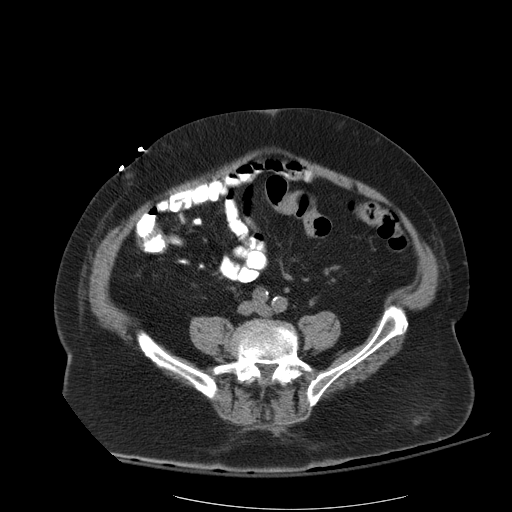
[im 48/105  soft-tissue]
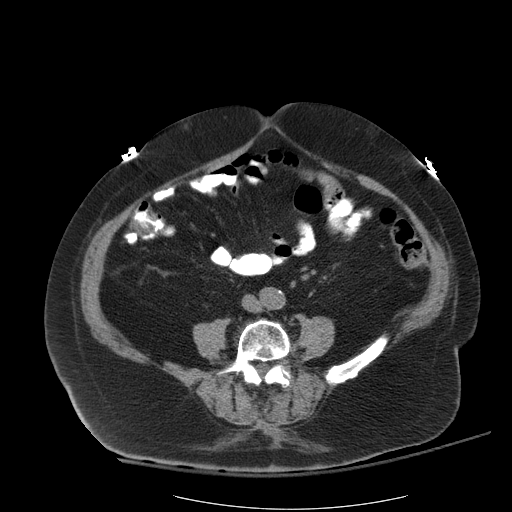
[im 57/105  soft-tissue]
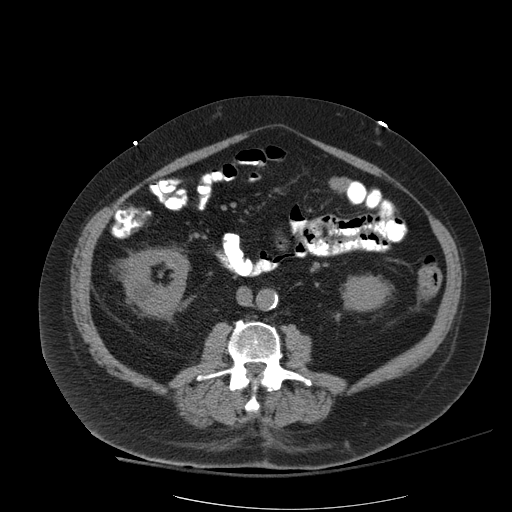
[im 61/105  soft-tissue]
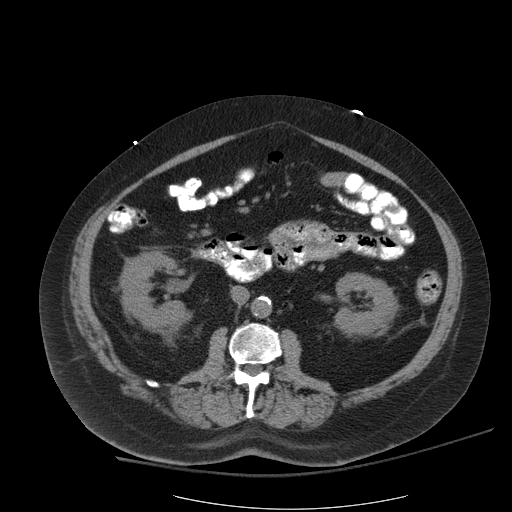
[im 61/105  bone]
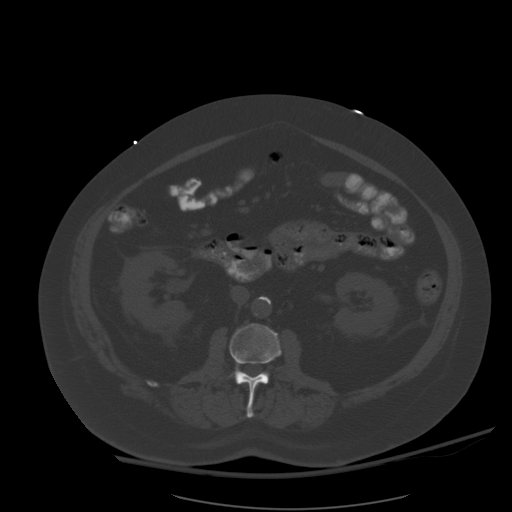
[im 70/105  soft-tissue]
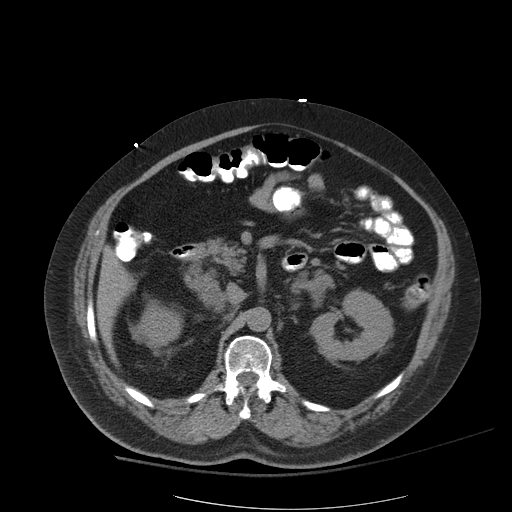
[im 79/105  soft-tissue]
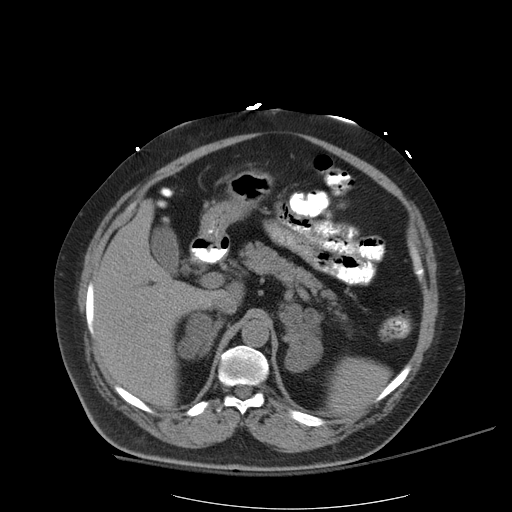
[im 83/105  soft-tissue]
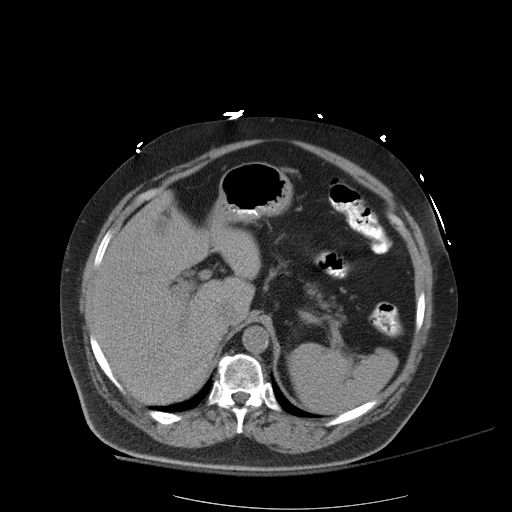
[im 92/105  soft-tissue]
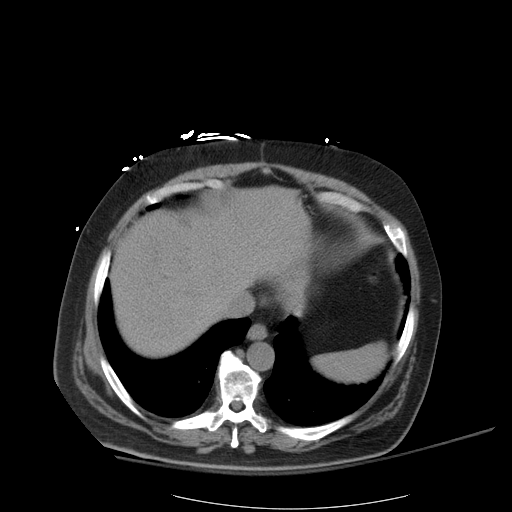
[im 100/105  soft-tissue]
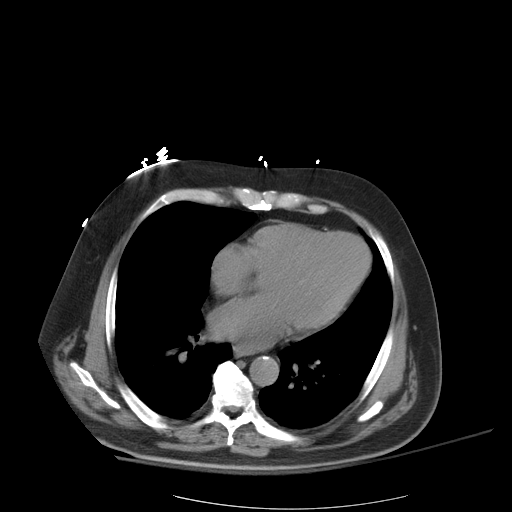

[mpr, coronals, coronal · coronal · 1.02mm/px · 3 of 120 slices shown]
[im 40/120  soft-tissue]
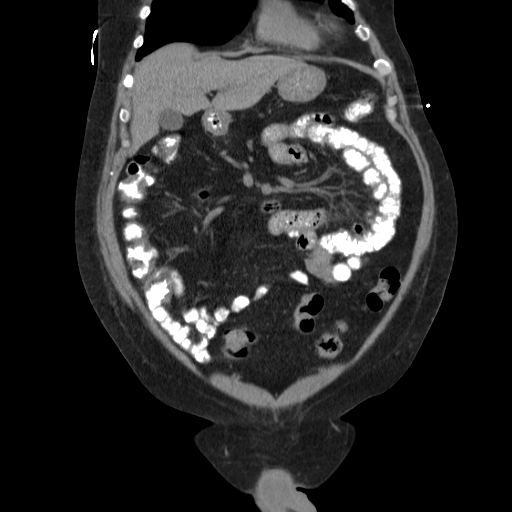
[im 53/120  soft-tissue]
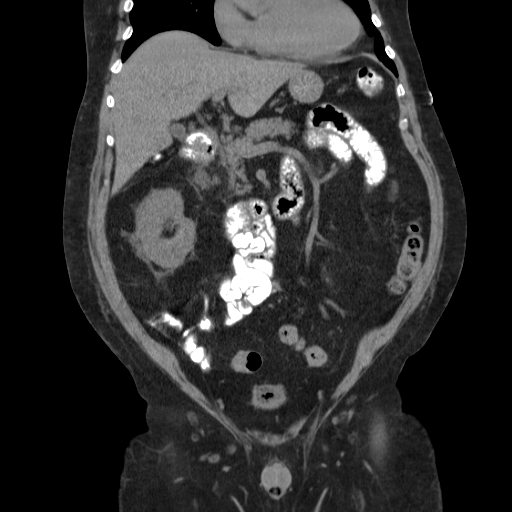
[im 67/120  soft-tissue]
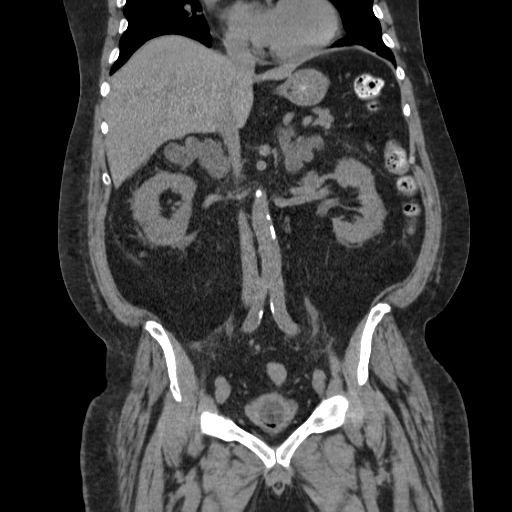

[17 of 46 positions shown; findings below may reference images not displayed]

FINDINGS: Limited view of the lung bases demonstrates minimal dependent
atelectasis, mild cardiomegaly. Pericardium is nonsuspicious.

The liver, spleen, pancreas, gallbladder are unremarkable for this
nonenhanced examination. Marked symmetric adrenal gland enlargement
measuring up to 29 mm with, with nodular configuration. 12 mm
hypodensity in right interpolar kidney could reflect a cyst. 18 mm
right lower pole hypodensity. No hydronephrosis or nephrolithiasis.
Nonspecific bilateral perinephric stranding without fluid
collection. The ureters are normal in course and caliber, no
urolithiasis. Urinary bladder is decompressed, containing a Foley
catheter.

Stomach, small and large bowel are normal in course and caliber,
contrast is yet to reach the distal colon. No superimposed
pericolonic inflammatory changes. Normal contrast filled appendix.
No intraperitoneal free fluid nor free air. Prostate is not
enlarged, mild prostatic calcifications. No lymphadenopathy by CT
size criteria. Moderate to severe L5-S1 degenerative disc disease.
IMPRESSION: No acute intra-abdominal or pelvic process.

Symmetrically enlarged nodular adrenal glands most consistent with
macronodular adrenocortical hyperplasia.

Low-density lesions measuring up to 18 mm in right kidney likely
reflects cysts. Nonspecific bilateral perinephric stranding.

  By: Ammie Andersen
